# Patient Record
Sex: Female | Born: 1990 | Race: Black or African American | Hispanic: No | Marital: Single | Smoking: Never smoker
Health system: Southern US, Community
[De-identification: ages and names within clinical notes are randomized; demographics above are authoritative.]

## PROBLEM LIST (undated history)

## (undated) HISTORY — PX: WISDOM TOOTH EXTRACTION: SHX21

---
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
## 2015-12-14 ENCOUNTER — Ambulatory Visit (INDEPENDENT_AMBULATORY_CARE_PROVIDER_SITE_OTHER): Payer: Managed Care, Other (non HMO) | Admitting: Allergy and Immunology

## 2015-12-14 ENCOUNTER — Encounter: Payer: Self-pay | Admitting: Allergy and Immunology

## 2015-12-14 VITALS — BP 108/60 | HR 80 | Temp 98.2°F | Resp 16 | Ht 64.76 in | Wt 132.3 lb

## 2015-12-14 DIAGNOSIS — J3089 Other allergic rhinitis: Secondary | ICD-10-CM | POA: Diagnosis not present

## 2015-12-14 DIAGNOSIS — L500 Allergic urticaria: Secondary | ICD-10-CM | POA: Diagnosis not present

## 2015-12-14 NOTE — Assessment & Plan Note (Signed)
   Aeroallergen avoidance measures have been discussed and provided in written form.  Cetirizine 10 mg daily as needed.

## 2015-12-14 NOTE — Assessment & Plan Note (Addendum)
Urticaria plus/minus angioedema versus allergic reaction.  Angioedema occurs in up to 50% of patients with idiopathic urticaria.  Often times the onset of urticaria is secondary to viral infection, even if clinical manifestations of an infection are not clearly evident. Once the mast cell membranes have been destabilized, it is not unusual for recurrent episodes of histamine release to occur in the ensuing weeks or months.  Skin tests to select food allergens were negative today. NSAIDs and emotional stress commonly exacerbate urticaria but are not the underlying etiology in this case. Physical urticarias are negative by history (i.e. pressure-induced, temperature, vibration, solar, etc.). History and lesions are not consistent with urticaria pigmentosa so I am not suspicious for mastocytosis. There are no concomitant symptoms concerning for anaphylaxis or constitutional symptoms worrisome for an underlying malignancy. We will not order labs at this time, however, if lesions recur, persist, progress, or change in character, we will assess other potential etiologies with screening labs.  For symptom relief, the patient is to take oral antihistamines as directed.  Instructions have been provided and discussed for H1/H2 receptor blockade with step-wise increase/decrease to find lowest effective dose.  Should symptoms recur, a journal is to be kept recording any foods eaten, beverages consumed, medications taken within a 6 hour period prior to the onset of symptoms, as well as record activities being performed, and environmental conditions. For any symptoms concerning for anaphylaxis, 911 is to be called immediately.

## 2015-12-14 NOTE — Patient Instructions (Addendum)
  Acute urticaria Urticaria plus/minus angioedema versus allergic reaction.  Angioedema occurs in up to 50% of patients with idiopathic urticaria.  Often times the onset of urticaria is secondary to viral infection, even if clinical manifestations of an infection are not clearly evident. Once the mast cell membranes have been destabilized, it is not unusual for recurrent episodes of histamine release to occur in the ensuing weeks or months.  Skin tests to select food allergens were negative today. NSAIDs and emotional stress commonly exacerbate urticaria but are not the underlying etiology in this case. Physical urticarias are negative by history (i.e. pressure-induced, temperature, vibration, solar, etc.). History and lesions are not consistent with urticaria pigmentosa so I am not suspicious for mastocytosis. There are no concomitant symptoms concerning for anaphylaxis or constitutional symptoms worrisome for an underlying malignancy. We will not order labs at this time, however, if lesions recur, persist, progress, or change in character, we will assess other potential etiologies with screening labs.  For symptom relief, the patient is to take oral antihistamines as directed.  Instructions have been provided and discussed for H1/H2 receptor blockade with step-wise increase/decrease to find lowest effective dose.  Should symptoms recur, a journal is to be kept recording any foods eaten, beverages consumed, medications taken within a 6 hour period prior to the onset of symptoms, as well as record activities being performed, and environmental conditions. For any symptoms concerning for anaphylaxis, 911 is to be called immediately.  Seasonal allergic rhinitis  Aeroallergen avoidance measures have been discussed and provided in written form.  Cetirizine 10 mg daily as needed.    Return if symptoms worsen or fail to improve.   Urticaria (Hives)  . Cetirizine (Zyrtec) 10mg  once a day.  If symptoms  continue then increase to .  Marland Kitchen. Cetirizine (Zyrtec) 10mg   twice a day.  If symptoms continue then increase to .  Marland Kitchen. Cetirizine (Zyrtec) 10mg   twice a day and Ranitidine (Zantac) 150 mg once a day.  If symptoms continue then increase to.  . Cetirizine (Zyrtec) 10mg   twice a day and Ranitidine (Zantac) 150 mg twice a day  May use Benadryl as needed for breakthrough symptoms       If no symptoms for 7 days, then step down dosage

## 2015-12-14 NOTE — Progress Notes (Signed)
New Patient Note  RE: Lisa ColaCharlie Levitan MRN: [id number] DOB: [birth year] Date of Office Visit: 12/14/2015  Referring provider: No ref. provider found Primary care provider: No PCP Per Patient  Chief Complaint: Urticaria   History of present illness: HPI Comments: Lisa Pittman is a 25 y.o. female residing for evaluation of possible allergic reaction.  She reports that 7 days ago she developed pruritus on her legs in the evening and the next morning woke up "itching everywhere."  She noticed hives on her arms, legs, back, abdomen, and chest.  She is uncertain but believes that she may have experienced mild numbness or swelling of her tongue.  She denies concomitant cardiopulmonary or other GI symptoms.  She went to the emergency department where she was treated with a steroid injection and diphenhydramine.  Her symptoms resolved initially but proceeded to "come and go at random" over the next 5 days. She has been asymptomatic over the past 2 days.  The meal that she had had prior to onset of symptoms consisted of barbecue chicken, green beans, corn, pineapple, cherries, pears, and curry.  She experiences occasional rhinorrhea and/or nasal congestion.  No significant seasonal symptom variation has been noted nor have specific environmental triggers been identified.   Assessment and plan: Acute urticaria Urticaria plus/minus angioedema versus allergic reaction.  Angioedema occurs in up to 50% of patients with idiopathic urticaria.  Often times the onset of urticaria is secondary to viral infection, even if clinical manifestations of an infection are not clearly evident. Once the mast cell membranes have been destabilized, it is not unusual for recurrent episodes of histamine release to occur in the ensuing weeks or months.  Skin tests to select food allergens were negative today. NSAIDs and emotional stress commonly exacerbate urticaria but are not the underlying etiology in this case. Physical urticarias  are negative by history (i.e. pressure-induced, temperature, vibration, solar, etc.). History and lesions are not consistent with urticaria pigmentosa so I am not suspicious for mastocytosis. There are no concomitant symptoms concerning for anaphylaxis or constitutional symptoms worrisome for an underlying malignancy. We will not order labs at this time, however, if lesions recur, persist, progress, or change in character, we will assess other potential etiologies with screening labs.  For symptom relief, the patient is to take oral antihistamines as directed.  Instructions have been provided and discussed for H1/H2 receptor blockade with step-wise increase/decrease to find lowest effective dose.  Should symptoms recur, a journal is to be kept recording any foods eaten, beverages consumed, medications taken within a 6 hour period prior to the onset of symptoms, as well as record activities being performed, and environmental conditions. For any symptoms concerning for anaphylaxis, 911 is to be called immediately.  Seasonal allergic rhinitis  Aeroallergen avoidance measures have been discussed and provided in written form.  Cetirizine 10 mg daily as needed.    Diagnositics: Environmental skin testing: Positive to grass pollen. Food allergen skin testing: Negative despite a positive histamine control.    Physical examination: Blood pressure 108/60, pulse 80, temperature 98.2 F (36.8 C), temperature source Oral, resp. rate 16, height 5' 4.76" (1.645 m), weight 132 lb 4.4 oz (60 kg).  General: Alert, interactive, in no acute distress. HEENT: TMs pearly gray, turbinates moderately edematous without discharge, post-pharynx mildly erythematous. Neck: Supple without lymphadenopathy. Lungs: Clear to auscultation without wheezing, rhonchi or rales. CV: Normal S1, S2 without murmurs. Abdomen: Nondistended, nontender. Skin: Warm and dry, without lesions or rashes. Extremities:  No clubbing,  cyanosis or edema. Neuro:   Grossly intact.  Review of systems:  Review of Systems  Constitutional: Negative for fever, chills and weight loss.  HENT: Positive for congestion. Negative for nosebleeds.   Eyes: Negative for blurred vision.  Respiratory: Negative for hemoptysis, shortness of breath and wheezing.   Cardiovascular: Negative for chest pain.  Gastrointestinal: Negative for nausea, vomiting, diarrhea and constipation.  Genitourinary: Negative for dysuria.  Musculoskeletal: Negative for myalgias and joint pain.  Skin: Positive for itching and rash.  Neurological: Negative for dizziness.  Endo/Heme/Allergies: Does not bruise/bleed easily.    Past medical history:  Other than issues mentioned in the history of present illness, no chronic diseases or recent hospitalizations have been reported.    Past surgical history:  Past Surgical History  Procedure Laterality Date  . Wisdom tooth extraction      Family history: Family History  Problem Relation Age of Onset  . Asthma Maternal Grandmother   . Allergic rhinitis Neg Hx   . Angioedema Neg Hx   . Eczema Neg Hx   . Atopy Neg Hx   . Immunodeficiency Neg Hx   . Urticaria Neg Hx     Social history: Social History   Social History  . Marital Status: Single    Spouse Name: N/A  . Number of Children: N/A  . Years of Education: N/A   Occupational History  . Not on file.   Social History Main Topics  . Smoking status: Never Smoker   . Smokeless tobacco: Not on file  . Alcohol Use: No  . Drug Use: No  . Sexual Activity: Not on file   Other Topics Concern  . Not on file   Social History Narrative  . No narrative on file   Environmental History: Patient lives in a house with carpeting in the bedroom, gas heat, and central air.  She is a nonsmoker without pets.    Medication List       This list is accurate as of: 12/14/15  8:04 PM.  Always use your most recent med list.               EPIPEN 2-PAK 0.3  mg/0.3 mL Soaj injection  Generic drug:  EPINEPHrine  INJECT INTO THE MUSCLE FOR ANAPHYLAXIC REACTION AS DIRECTED THEN PROCEED TO THE ER     hydrOXYzine 50 MG capsule  Commonly known as:  VISTARIL  Reported on 12/14/2015        Known medication allergies: No Known Allergies  I appreciate the opportunity to take part in this Jossalin's care. Please do not hesitate to contact me with questions.  Sincerely,   R. Jorene Guest, MD

## 2016-12-21 ENCOUNTER — Emergency Department (HOSPITAL_COMMUNITY): Payer: Managed Care, Other (non HMO)

## 2016-12-21 ENCOUNTER — Emergency Department (HOSPITAL_COMMUNITY)
Admission: EM | Admit: 2016-12-21 | Discharge: 2016-12-21 | Disposition: A | Discharge status: Home / Self Care | Payer: Managed Care, Other (non HMO) | Attending: Emergency Medicine | Admitting: Emergency Medicine

## 2016-12-21 DIAGNOSIS — Z79899 Other long term (current) drug therapy: Secondary | ICD-10-CM | POA: Insufficient documentation

## 2016-12-21 DIAGNOSIS — Y939 Activity, unspecified: Secondary | ICD-10-CM | POA: Diagnosis not present

## 2016-12-21 DIAGNOSIS — Y92410 Unspecified street and highway as the place of occurrence of the external cause: Secondary | ICD-10-CM | POA: Insufficient documentation

## 2016-12-21 DIAGNOSIS — Y999 Unspecified external cause status: Secondary | ICD-10-CM | POA: Insufficient documentation

## 2016-12-21 DIAGNOSIS — S60212A Contusion of left wrist, initial encounter: Secondary | ICD-10-CM

## 2016-12-21 DIAGNOSIS — S6992XA Unspecified injury of left wrist, hand and finger(s), initial encounter: Secondary | ICD-10-CM | POA: Diagnosis present

## 2016-12-21 DIAGNOSIS — S63502A Unspecified sprain of left wrist, initial encounter: Secondary | ICD-10-CM

## 2016-12-21 MED ORDER — IBUPROFEN 800 MG PO TABS: 800.0000 mg | ORAL_TABLET | Freq: Three times a day (TID) | ORAL | 0 refills | Status: AC | PRN

## 2016-12-21 MED ORDER — TRAMADOL HCL 50 MG PO TABS: 50.0000 mg | ORAL_TABLET | Freq: Four times a day (QID) | ORAL | 0 refills | Status: AC | PRN
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
## 2016-12-21 NOTE — ED Notes (Signed)
Patient was alert, oriented and stable upon discharge. RN went over AVS and patient had no further questions.  

## 2016-12-21 NOTE — Discharge Instructions (Signed)
Return here as needed.  Follow-up with your primary doctor. °
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
## 2018-06-22 ENCOUNTER — Encounter (HOSPITAL_COMMUNITY): Payer: Self-pay

## 2018-06-22 ENCOUNTER — Emergency Department (HOSPITAL_COMMUNITY): Payer: Managed Care, Other (non HMO)

## 2018-06-22 ENCOUNTER — Emergency Department (HOSPITAL_COMMUNITY)
Admission: EM | Admit: 2018-06-22 | Discharge: 2018-06-22 | Disposition: A | Discharge status: Home / Self Care | Payer: Managed Care, Other (non HMO) | Attending: Emergency Medicine | Admitting: Emergency Medicine

## 2018-06-22 ENCOUNTER — Other Ambulatory Visit: Payer: Self-pay

## 2018-06-22 DIAGNOSIS — Y92410 Unspecified street and highway as the place of occurrence of the external cause: Secondary | ICD-10-CM | POA: Diagnosis not present

## 2018-06-22 DIAGNOSIS — S70311A Abrasion, right thigh, initial encounter: Secondary | ICD-10-CM | POA: Diagnosis not present

## 2018-06-22 DIAGNOSIS — S3991XA Unspecified injury of abdomen, initial encounter: Secondary | ICD-10-CM | POA: Diagnosis present

## 2018-06-22 DIAGNOSIS — S301XXA Contusion of abdominal wall, initial encounter: Secondary | ICD-10-CM | POA: Diagnosis not present

## 2018-06-22 DIAGNOSIS — Y93I9 Activity, other involving external motion: Secondary | ICD-10-CM | POA: Insufficient documentation

## 2018-06-22 DIAGNOSIS — Y998 Other external cause status: Secondary | ICD-10-CM | POA: Diagnosis not present

## 2018-06-22 DIAGNOSIS — S50811A Abrasion of right forearm, initial encounter: Secondary | ICD-10-CM | POA: Diagnosis not present

## 2018-06-22 DIAGNOSIS — S8002XA Contusion of left knee, initial encounter: Secondary | ICD-10-CM

## 2018-06-22 DIAGNOSIS — S63502A Unspecified sprain of left wrist, initial encounter: Secondary | ICD-10-CM | POA: Insufficient documentation

## 2018-06-22 DIAGNOSIS — T07XXXA Unspecified multiple injuries, initial encounter: Secondary | ICD-10-CM

## 2018-06-22 LAB — COMPREHENSIVE METABOLIC PANEL
ALT: 16 U/L (ref 0–44)
ANION GAP: 7 (ref 5–15)
AST: 22 U/L (ref 15–41)
Albumin: 4.1 g/dL (ref 3.5–5.0)
Alkaline Phosphatase: 49 U/L (ref 38–126)
BUN: 14 mg/dL (ref 6–20)
CHLORIDE: 108 mmol/L (ref 98–111)
CO2: 23 mmol/L (ref 22–32)
CREATININE: 0.77 mg/dL (ref 0.44–1.00)
Calcium: 9 mg/dL (ref 8.9–10.3)
GFR calc Af Amer: >60 mL/min (ref >60)
GFR calc non Af Amer: >60 mL/min (ref >60)
Glucose, Bld: 97 mg/dL (ref 70–99)
Potassium: 4.1 mmol/L (ref 3.5–5.1)
SODIUM: 138 mmol/L (ref 135–145)
Total Bilirubin: 0.4 mg/dL (ref 0.3–1.2)
Total Protein: 7.1 g/dL (ref 6.5–8.1)

## 2018-06-22 LAB — CBC WITH DIFFERENTIAL/PLATELET
Abs Immature Granulocytes: 0.04 10*3/uL (ref 0.00–0.07)
Basophils Absolute: 0 10*3/uL (ref 0.0–0.1)
Basophils Relative: 0 %
EOS ABS: 0.1 10*3/uL (ref 0.0–0.5)
EOS PCT: 2 %
HCT: 41.1 % (ref 36.0–46.0)
HEMOGLOBIN: 12.2 g/dL (ref 12.0–15.0)
Immature Granulocytes: 0 %
LYMPHS ABS: 1.6 10*3/uL (ref 0.7–4.0)
LYMPHS PCT: 17 %
MCH: 22.2 pg — ABNORMAL LOW (ref 26.0–34.0)
MCHC: 29.7 g/dL — AB (ref 30.0–36.0)
MCV: 74.9 fL — AB (ref 80.0–100.0)
MONOS PCT: 6 %
Monocytes Absolute: 0.6 10*3/uL (ref 0.1–1.0)
NEUTROS ABS: 7 10*3/uL (ref 1.7–7.7)
NEUTROS PCT: 75 %
Platelets: 298 10*3/uL (ref 150–400)
RBC: 5.49 MIL/uL — ABNORMAL HIGH (ref 3.87–5.11)
RDW: 15.1 % (ref 11.5–15.5)
WBC: 9.4 10*3/uL (ref 4.0–10.5)
nRBC: 0 % (ref 0.0–0.2)

## 2018-06-22 LAB — I-STAT CHEM 8, ED
BUN: 13 mg/dL (ref 6–20)
CREATININE: 0.8 mg/dL (ref 0.44–1.00)
Calcium, Ion: 1.2 mmol/L (ref 1.15–1.40)
Chloride: 106 mmol/L (ref 98–111)
GLUCOSE: 94 mg/dL (ref 70–99)
HEMATOCRIT: 42 % (ref 36.0–46.0)
HEMOGLOBIN: 14.3 g/dL (ref 12.0–15.0)
POTASSIUM: 4 mmol/L (ref 3.5–5.1)
Sodium: 138 mmol/L (ref 135–145)
TCO2: 24 mmol/L (ref 22–32)

## 2018-06-22 LAB — I-STAT BETA HCG BLOOD, ED (MC, WL, AP ONLY): I-stat hCG, quantitative: <5 m[IU]/mL (ref <5)

## 2018-06-22 MED ORDER — SODIUM CHLORIDE 0.9 % IV BOLUS
1000.0000 mL | Freq: Once | INTRAVENOUS | Status: AC
  Administered 2018-06-22: 1000 mL via INTRAVENOUS

## 2018-06-22 MED ORDER — CYCLOBENZAPRINE HCL 10 MG PO TABS: 10.0000 mg | ORAL_TABLET | Freq: Two times a day (BID) | ORAL | 0 refills | Status: AC | PRN

## 2018-06-22 MED ORDER — ONDANSETRON HCL 4 MG/2ML IJ SOLN
4.0000 mg | Freq: Once | INTRAMUSCULAR | Status: AC
  Administered 2018-06-22: 4 mg via INTRAVENOUS
  Filled 2018-06-22: qty 2

## 2018-06-22 MED ORDER — KETOROLAC TROMETHAMINE 30 MG/ML IJ SOLN
30.0000 mg | Freq: Once | INTRAMUSCULAR | Status: AC
  Administered 2018-06-22: 30 mg via INTRAVENOUS
  Filled 2018-06-22: qty 1

## 2018-06-22 MED ORDER — IOPAMIDOL (ISOVUE-300) INJECTION 61%
100.0000 mL | Freq: Once | INTRAVENOUS | Status: AC | PRN
  Administered 2018-06-22: 100 mL via INTRAVENOUS

## 2018-06-22 MED ORDER — SODIUM CHLORIDE 0.9 % IJ SOLN
INTRAMUSCULAR | Status: AC
  Administered 2018-06-22: 20:00:00
  Filled 2018-06-22: qty 50

## 2018-06-22 MED ORDER — IOPAMIDOL (ISOVUE-300) INJECTION 61%
INTRAVENOUS | Status: AC
  Filled 2018-06-22: qty 100

## 2018-06-22 NOTE — ED Notes (Signed)
Wrist splint applied

## 2018-06-22 NOTE — ED Notes (Signed)
Patient transported to CT 
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
## 2019-04-30 ENCOUNTER — Other Ambulatory Visit: Payer: Self-pay

## 2019-04-30 DIAGNOSIS — Z20822 Contact with and (suspected) exposure to covid-19: Secondary | ICD-10-CM

## 2019-05-01 LAB — NOVEL CORONAVIRUS, NAA: SARS-CoV-2, NAA: NOT DETECTED
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
## 2019-07-08 ENCOUNTER — Other Ambulatory Visit: Payer: Self-pay

## 2019-07-08 DIAGNOSIS — Z20822 Contact with and (suspected) exposure to covid-19: Secondary | ICD-10-CM

## 2019-07-09 LAB — NOVEL CORONAVIRUS, NAA: SARS-CoV-2, NAA: NOT DETECTED

## 2019-12-13 ENCOUNTER — Ambulatory Visit: Payer: Managed Care, Other (non HMO)
